# Patient Record
Sex: Female | Born: 1975 | Race: Asian | Hispanic: No | Marital: Married | Smoking: Never smoker
Health system: Southern US, Community
[De-identification: ages and names within clinical notes are randomized; demographics above are authoritative.]

## PROBLEM LIST (undated history)

## (undated) DIAGNOSIS — Z789 Other specified health status: Secondary | ICD-10-CM

---

## 2007-06-24 ENCOUNTER — Ambulatory Visit (HOSPITAL_COMMUNITY): Admission: RE | Admit: 2007-06-24 | Discharge: 2007-06-24 | Payer: Self-pay | Admitting: Obstetrics and Gynecology

## 2007-07-20 ENCOUNTER — Inpatient Hospital Stay (HOSPITAL_COMMUNITY): Admission: RE | Admit: 2007-07-20 | Discharge: 2007-07-23 | Payer: Self-pay | Admitting: Obstetrics and Gynecology

## 2008-07-29 IMAGING — US US OB DETAIL+14 WK
1 series · 14 of 28 positions shown · non-contrast
Comparison: none

OBSTETRICAL ULTRASOUND:
 This ultrasound was performed in The [HOSPITAL], and the [facility] report will be stored to [REDACTED] PACS.

[Series 1: us ob detail+14 wk · 14 of 48 slices shown]
[im 2/48]
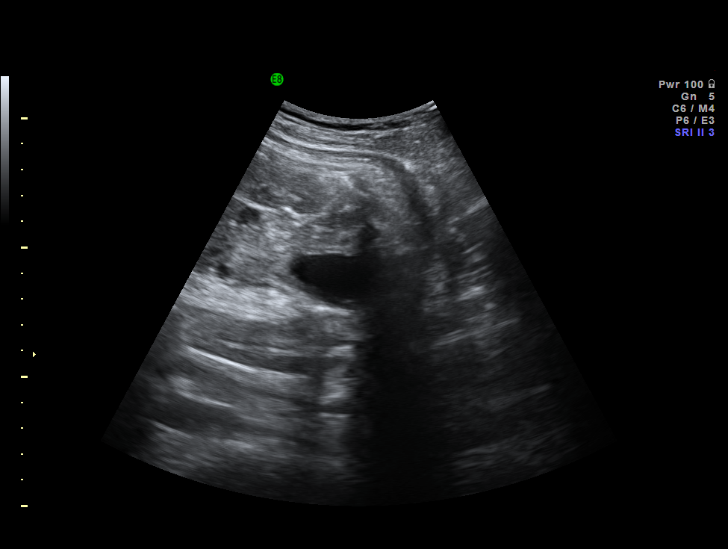
[im 6/48]
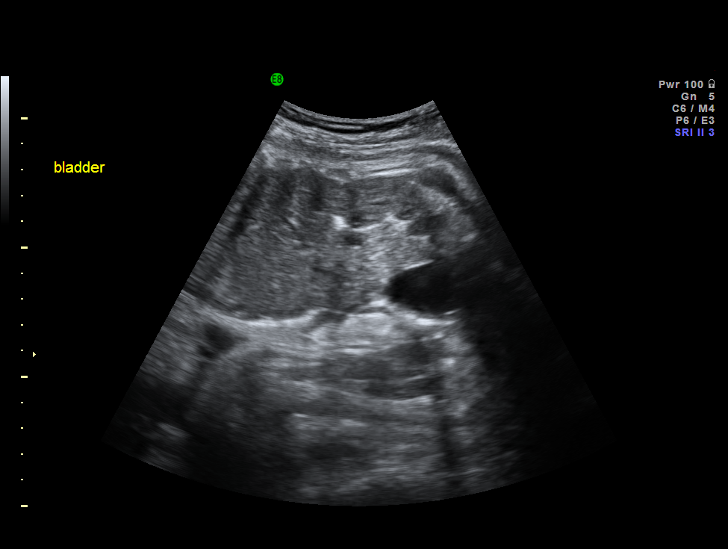
[im 9/48]
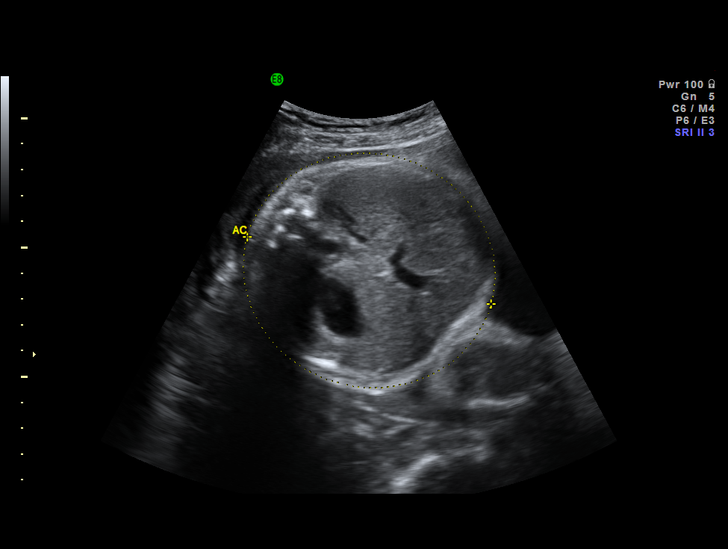
[im 13/48]
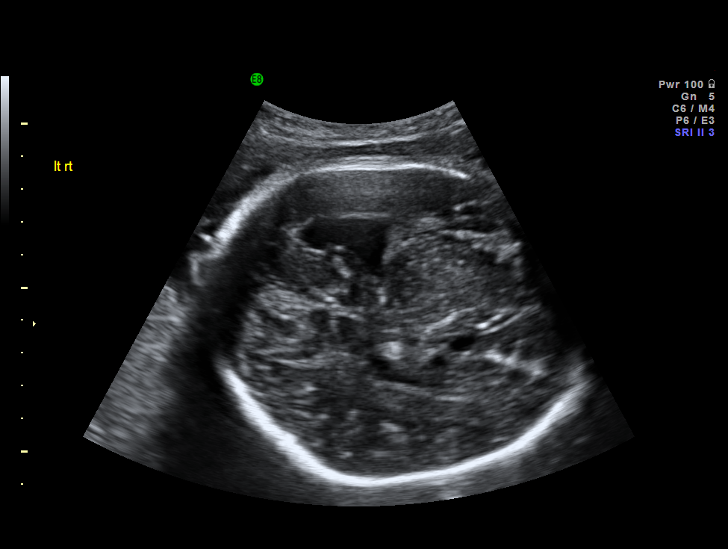
[im 16/48]
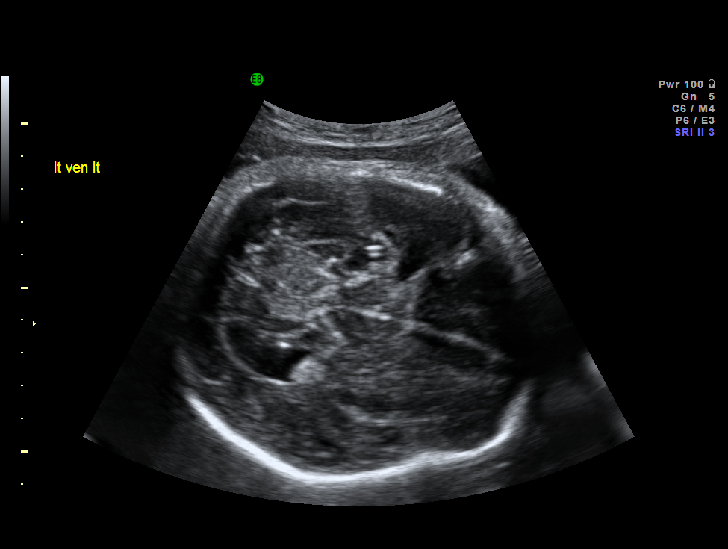
[im 20/48]
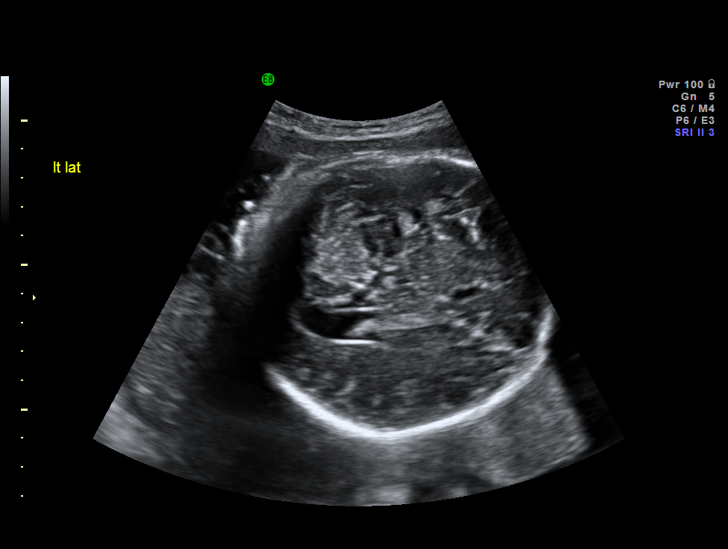
[im 23/48]
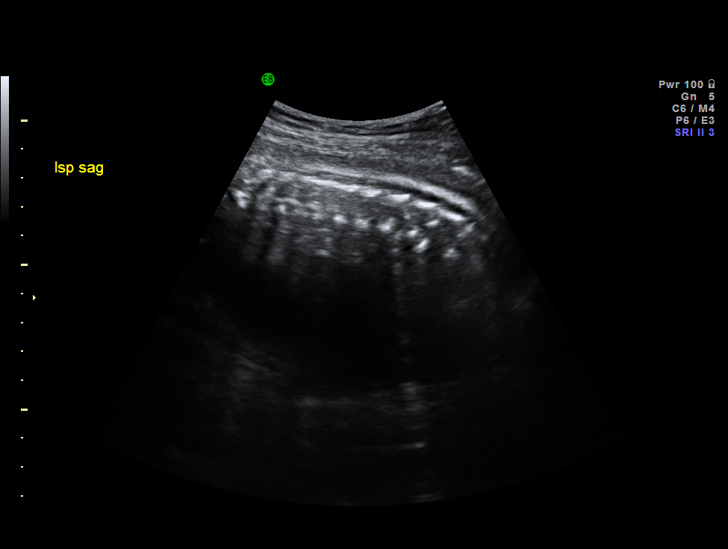
[im 27/48]
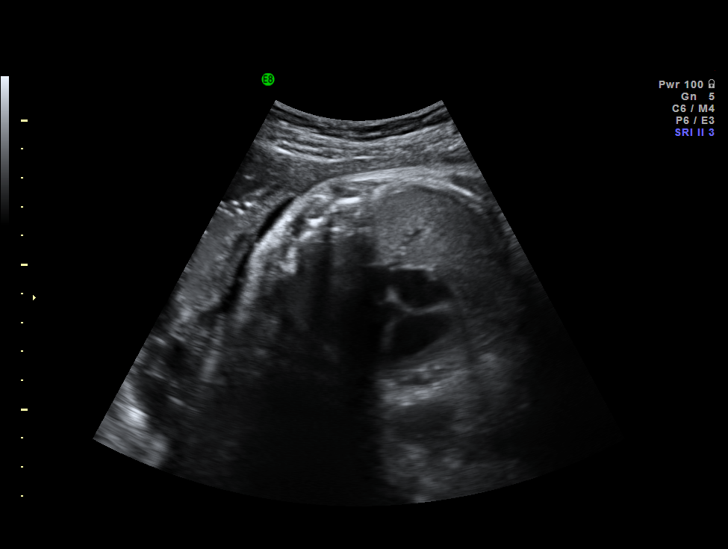
[im 30/48]
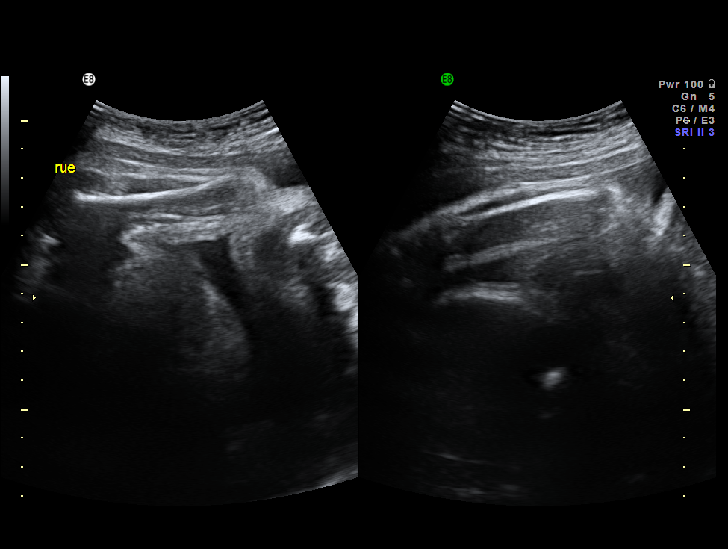
[im 34/48]
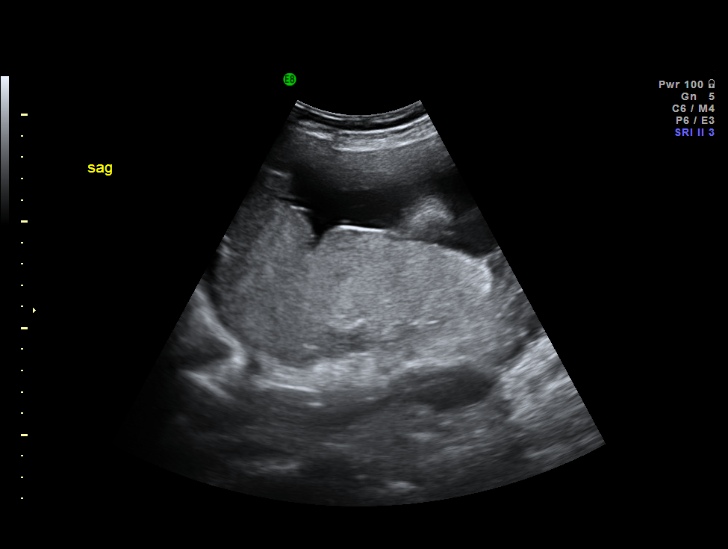
[im 37/48]
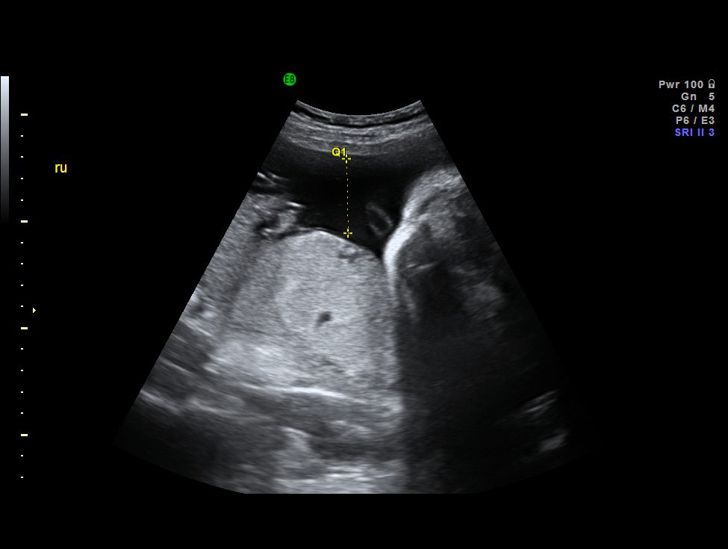
[im 41/48]
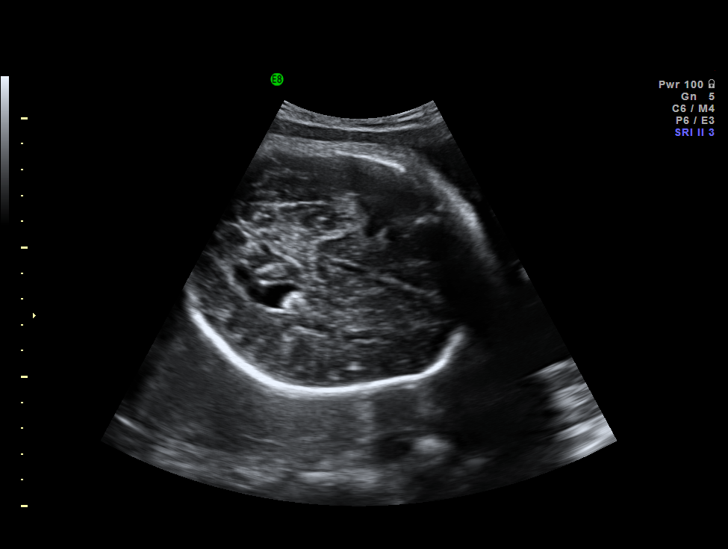
[im 44/48]
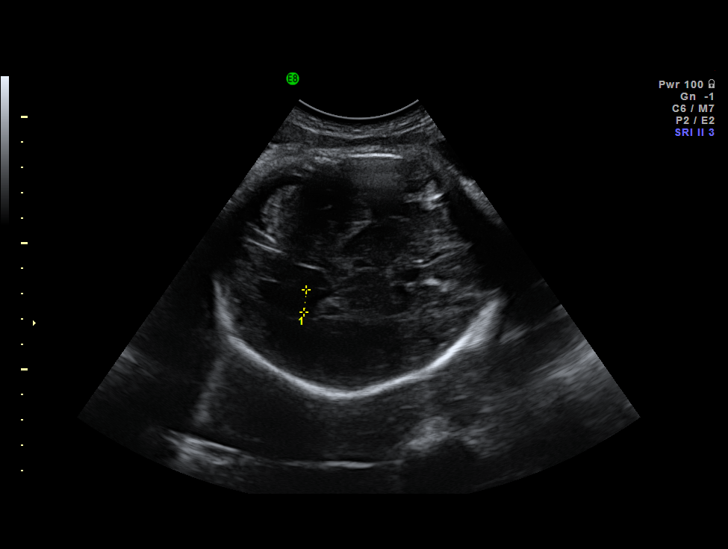
[im 48/48]
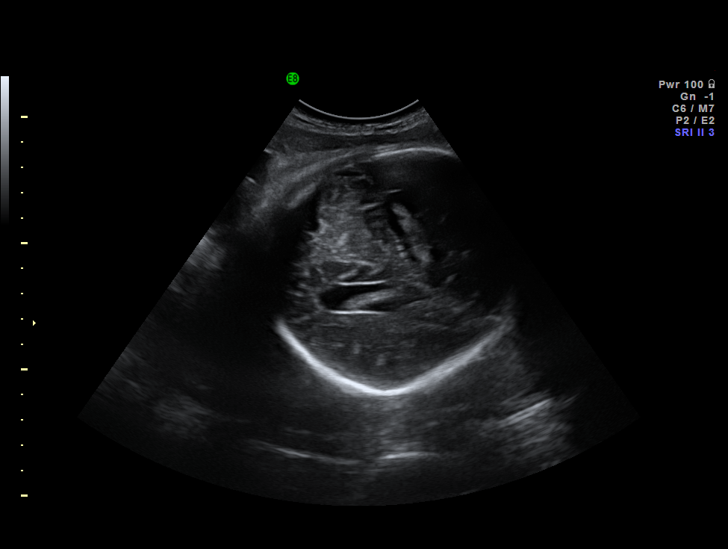

[14 of 28 positions shown; findings below may reference images not displayed]

IMPRESSION: The [facility] report has also been faxed to the ordering physician.

## 2010-07-30 ENCOUNTER — Encounter: Admission: RE | Admit: 2010-07-30 | Discharge: 2010-09-05 | Payer: Self-pay | Admitting: Obstetrics and Gynecology

## 2010-10-02 ENCOUNTER — Inpatient Hospital Stay (HOSPITAL_COMMUNITY): Admission: RE | Admit: 2010-10-02 | Discharge: 2010-10-05 | Payer: Self-pay | Admitting: Obstetrics and Gynecology

## 2011-02-18 LAB — CBC
Hemoglobin: 11.1 g/dL — ABNORMAL LOW (ref 12.0–15.0)
MCH: 34.1 pg — ABNORMAL HIGH (ref 26.0–34.0)
MCHC: 33.8 g/dL (ref 30.0–36.0)
MCV: 100.2 fL — ABNORMAL HIGH (ref 78.0–100.0)
Platelets: 179 10*3/uL (ref 150–400)
Platelets: 197 10*3/uL (ref 150–400)
RBC: 3.27 MIL/uL — ABNORMAL LOW (ref 3.87–5.11)
RDW: 14.3 % (ref 11.5–15.5)
WBC: 11.6 10*3/uL — ABNORMAL HIGH (ref 4.0–10.5)
WBC: 9 10*3/uL (ref 4.0–10.5)

## 2011-02-18 LAB — BASIC METABOLIC PANEL
BUN: 6 mg/dL (ref 6–23)
Chloride: 100 mEq/L (ref 96–112)
Creatinine, Ser: 0.34 mg/dL — ABNORMAL LOW (ref 0.4–1.2)
Glucose, Bld: 89 mg/dL (ref 70–99)
Potassium: 3.6 mEq/L (ref 3.5–5.1)

## 2011-02-18 LAB — GLUCOSE, CAPILLARY
Glucose-Capillary: 80 mg/dL (ref 70–99)
Glucose-Capillary: 93 mg/dL (ref 70–99)

## 2011-02-18 LAB — SURGICAL PCR SCREEN
MRSA, PCR: NEGATIVE
Staphylococcus aureus: NEGATIVE

## 2011-02-18 LAB — TYPE AND SCREEN: ABO/RH(D): O POS

## 2011-04-21 NOTE — Op Note (Signed)
NAME:  Mallory Baldwin, PFAHLER NO.:  [id number]   MEDICAL RECORD NO.:  [id number]          PATIENT TYPE:  INP   LOCATION:  9142                          FACILITY:  [facility]   PHYSICIAN:  Duke Salvia. Marcelle Overlie, M.D.DATE OF BIRTH:  Aug 15, 1976   DATE OF PROCEDURE:  07/20/2007  DATE OF DISCHARGE:                               OPERATIVE REPORT   PREOPERATIVE DIAGNOSIS:  1. Term intrauterine pregnancy.  2. Breech presentation.   POSTOPERATIVE DIAGNOSIS:  1. Term intrauterine pregnancy.  2. Breech presentation.   PROCEDURE:  Primary low transverse cesarean section.   SURGEON:  Dr. Marcelle Overlie   ANESTHESIA:  Spinal.   COMPLICATIONS:  None.   DRAINS:  Foley catheter   PROCEDURE AND FINDINGS:  The patient taken to the operating room, after  an adequate level of spinal anesthetic was obtained, the patient in left  tilt position.  The abdomen prepped and usual manner for sterile  abdominal procedures.  Foley catheter positioned draining clear.  Transverse Pfannenstiel incision was made two fingerbreadths above the  symphysis, carried down the fascia which was incised and extended  transversely.  Rectus muscle divided midline.  Peritoneum entered  superiorly without incident and extended in a vertical fashion.  The  vesicouterine serosa was incised and the bladder was bluntly and sharply  dissected off of the lower uterine segment and the bladder blade was  repositioned.  Transverse incision made, the lower segment extended with  bandage scissors.  Clear fluid noted.  The patient then delivered of a  healthy infant in the frank breech presentation.  Nuchal cord x1 was  noted easy delivery of the chest and vertex.  The infant was suctioned,  cord clamped and passed to pediatric team.  Apgars 08/09, female.  The  placenta was then removed manually intact.  Uterus exteriorized, cavity  wiped clean with laparotomy pack.  Closure was obtained first layer with  0 chromic in locked fashion  followed by an imbricating layer with 0  chromic.  This was hemostatic.  The tubes and ovaries were noted to be  normal.  The bladder flap area was intact and hemostatic.  Prior to  closure sponge, needle and instrument counts reported as correct x2.  Peritoneum closed with a running 2-0 Vicryl suture.  Rectus muscles  reapproximated with 2-0 Vicryl interrupted sutures.  Fascia closed from  laterally to midline on either side with a 0 PDS running suture.  Subcutaneous tissue was hemostatic.  Clips and Steri-Strips used on the  skin.  She did receive Ancef 1 gram IV after the cord was clamped along  Pitocin IV.  Clear urine noted at end of the case.  Mother and baby  doing well at that point.      Richard M. Marcelle Overlie, M.D.  Electronically Signed     RM[facility]/MEDQ  D:  07/20/2007  T:  07/20/2007  Job:  [id number]

## 2011-04-21 NOTE — H&P (Signed)
NAME:  Mallory Baldwin, Mallory Baldwin [facility]                ACCOUNT NO.:  [id number]   MEDICAL RECORD NO.:  [id number]          PATIENT TYPE:  INP   LOCATION:                                FACILITY:  [facility]   PHYSICIAN:  Duke Salvia. Marcelle Overlie, M.D.    DATE OF BIRTH:   DATE OF ADMISSION:  07/20/2007  DATE OF DISCHARGE:                              HISTORY & PHYSICAL   CHIEF COMPLAINT:  Breech presentation at term.   HISTORY OF PRESENT ILLNESS:  A 35 year old G1, P0 EDD August 23.  Has  been noted to have a persistent breech presentation, presents for  primary cesarean section. This procedure including risks of bleeding,  infection, transfusion, adjacent organ injury, wound infection, all  reviewed with her which she understands and accepts.   Her GBS screen has been positive.  This fetus has had borderline  ventriculomegaly, followed by serial ultrasound including evaluation by  maternal fetal medicine who recommended post natal follow up Pediatrics.   Recently, she had exposure to B19 parvovirus with IgG and IgM negative.  She has not had any URI or rash symptoms.   Her first trimester screen was normal with a 1/221 trisomy 21 risk.  She  declined further evaluation with amniocentesis.  Her blood type is O  positive.   ALLERGIES:  None.   For her past medical history including family history and social  history, please see her [facility] form.   PHYSICAL EXAMINATION:  VITAL SIGNS:  Temperature 98.2, blood pressure  120/82.  HEENT:  Unremarkable.  NECK:  Supple without masses.  LUNGS:  Clear.  CARDIOVASCULAR:  Regular rate and rhythm without murmurs, rubs or  gallops noted .  BREASTS:  Not examined.  ABDOMEN:  Term fundal height, fetal heart rate 140s.  Breech  presentation by Leopold's.  Cervix was closed.  EXTREMITIES:  Unremarkable.  NEUROLOGICAL:  Unremarkable.   IMPRESSION:  Persistent breech presentation at term.   PLAN:  Primary cesarean section.  Procedure and risks as reviewed  above.     Richard M. Marcelle Overlie, M.D.  Electronically Signed    RM[facility]/MEDQ  D:  07/19/2007  T:  07/20/2007  Job:  [id number]

## 2011-04-24 NOTE — Discharge Summary (Signed)
NAME:  Mallory Baldwin, Mallory Baldwin NO.:  [id number]   MEDICAL RECORD NO.:  [id number]          PATIENT TYPE:  INP   LOCATION:  9142                          FACILITY:  [facility]   PHYSICIAN:  Juluis Mire, M.D.   DATE OF BIRTH:  [birth year]   DATE OF ADMISSION:  07/20/2007  DATE OF DISCHARGE:  07/23/2007                               DISCHARGE SUMMARY   ADMITTING DIAGNOSES:  1. Intrauterine pregnancy at term.  2. Breech presentation.   DISCHARGE DIAGNOSIS:  Status post low transverse cesarean section with  delivery of a viable female infant.   PROCEDURE:  Primary low transverse cesarean section.   REASON FOR ADMISSION:  Please see dictated H&P.   HOSPITAL COURSE:  The patient is a 35 year old primigravida who was  admitted to [facility] for persistent breech  presentation.  Otherwise, pregnancy had been uncomplicated.  On the  morning of admission, the patient was taken to operating room, where  spinal anesthesia was administered without difficulty.  A low transverse  incision was made with delivery of a viable female infant weighing 7  pounds 6 ounces with Apgars of 9 at 1 minute and 9 at 5 minutes.  The  patient tolerated the procedure well and was taken to the recovery room  in stable condition.  On postoperative day #1, the patient was without  complaint.  Vital signs were stable.  She was afebrile.  Abdomen was  soft with good return of bowel function.  Fundus was firm and nontender.  Abdominal dressing was noted to have a scant amount of old drainage  noted on the bandage.  She is ambulating well.  Laboratory findings  revealed a hemoglobin of 10.1, platelet count 183,000, WBC count of  11.4.  Blood type was noted to be O positive.  On postoperative day #2,  the patient was without complaint.  Vital signs were stable.  Fundus was  firm and nontender.  Incision was clean, dry and intact.  She was  voiding well and tolerating a regular diet without complaints  of nausea  or vomiting.  On postoperative day #3, the patient was without  complaint.  Vital signs were stable.  Abdomen soft.  Fundus firm and  nontender.  Incision was clean, dry and intact.  Staples were removed  and the patient was later discharged home.   CONDITION ON DISCHARGE:  Good.   DIET:  Regular as tolerated.   ACTIVITY:  No heavy lifting.  No driving for 2 weeks.  No vaginal entry.   FOLLOWUP:  The patient is to follow up in the office in 1-2 weeks for  incision check.  She is to call for temperature greater than 100  degrees, persistent nausea, vomiting, heavy vaginal bleeding and/or  redness or drainage from the incisional site.   DISCHARGE MEDICATIONS:  1. Tylox, #30, one p.o. every 4-6 hours p.r.n.  2. Motrin 600 mg every 6 hours.  3. Prenatal vitamins one p.o. daily.  4. Colace one p.o. daily p.r.n.      Julio Sicks, N.P.      Raphael Gibney.  McComb, M.D.  Electronically Signed    CC/MEDQ  D:  08/25/2007  T:  08/25/2007  Job:  [id number]

## 2011-09-21 LAB — CBC
HCT: 35.7 — ABNORMAL LOW
Hemoglobin: 10.1 — ABNORMAL LOW
Hemoglobin: 12.1
MCHC: 33.7
MCV: 98.2
Platelets: 205
RBC: 3 — ABNORMAL LOW
RBC: 3.64 — ABNORMAL LOW
WBC: 10.4
WBC: 11.4 — ABNORMAL HIGH

## 2011-09-21 LAB — ABO/RH: ABO/RH(D): O POS

## 2011-09-21 LAB — TYPE AND SCREEN: Antibody Screen: NEGATIVE

## 2012-03-25 ENCOUNTER — Inpatient Hospital Stay (HOSPITAL_COMMUNITY)
Admission: AD | Admit: 2012-03-25 | Discharge: 2012-03-25 | Disposition: A | Discharge status: Home / Self Care | Payer: BC Managed Care – PPO | Source: Ambulatory Visit | Attending: Obstetrics and Gynecology | Admitting: Obstetrics and Gynecology

## 2012-03-25 ENCOUNTER — Encounter (HOSPITAL_COMMUNITY): Payer: Self-pay

## 2012-03-25 DIAGNOSIS — N920 Excessive and frequent menstruation with regular cycle: Secondary | ICD-10-CM

## 2012-03-25 DIAGNOSIS — N946 Dysmenorrhea, unspecified: Secondary | ICD-10-CM | POA: Insufficient documentation

## 2012-03-25 HISTORY — DX: Other specified health status: Z78.9

## 2012-03-25 LAB — URINALYSIS, ROUTINE W REFLEX MICROSCOPIC
Glucose, UA: NEGATIVE mg/dL
Leukocytes, UA: NEGATIVE
Protein, ur: NEGATIVE mg/dL
Specific Gravity, Urine: <1.005 — ABNORMAL LOW (ref 1.005–1.030)

## 2012-03-25 LAB — URINE MICROSCOPIC-ADD ON: WBC, UA: NONE SEEN WBC/hpf (ref <3)

## 2012-03-25 LAB — CBC
HCT: 36.2 % (ref 36.0–46.0)
Hemoglobin: 11.7 g/dL — ABNORMAL LOW (ref 12.0–15.0)
MCH: 30.5 pg (ref 26.0–34.0)
MCHC: 32.3 g/dL (ref 30.0–36.0)
RDW: 12.5 % (ref 11.5–15.5)

## 2012-03-25 MED ORDER — IBUPROFEN 600 MG PO TABS: 600.0000 mg | ORAL_TABLET | Freq: Four times a day (QID) | ORAL | Status: AC | PRN

## 2012-03-25 NOTE — MAU Note (Signed)
Patient states she started her period on 4-17 and it is very heavy and having a lot of cramping.

## 2012-03-25 NOTE — MAU Note (Signed)
Patient is wearing a tampon and unable to assess the bleeding. Denies feel weak or dizzy.

## 2012-03-25 NOTE — MAU Provider Note (Signed)
History     CSN: [id number]  Arrival date and time: 03/25/12 1541   First Provider Initiated Contact with Patient 03/25/12 1806      Chief Complaint  Patient presents with  . Vaginal Bleeding  . Abdominal Cramping   HPI This is a 36 y.o. female who presents with report of one day of heavier than normal menstrual bleeding and cramping for the first time. States has never had this happen before.  OB History    Grav Para Term Preterm Abortions TAB SAB Ect Mult Living   2 2 2  0 0 0 0 0 0 2      Past Medical History  Diagnosis Date  . No pertinent past medical history     Past Surgical History  Procedure Date  . Cesarean section     Family History  Problem Relation Age of Onset  . Anesthesia problems Neg Hx     History  Substance Use Topics  . Smoking status: Never Smoker   . Smokeless tobacco: Not on file  . Alcohol Use:     Allergies: No Known Allergies  Prescriptions prior to admission  Medication Sig Dispense Refill  . ibuprofen (ADVIL,MOTRIN) 200 MG tablet Take 400 mg by mouth every 6 (six) hours as needed. For pain.        ROS Physical Exam   Blood pressure 126/83, pulse 73, temperature 98.6 F (37 C), temperature source Oral, resp. rate 16, height 5\' 2"  (1.575 m), weight 109 lb 12.8 oz (49.805 kg), last menstrual period 03/23/2012, SpO2 100.00%.  Physical Exam  Constitutional: She is oriented to person, place, and time. She appears well-developed and well-nourished. No distress.  Cardiovascular: Normal rate.   Respiratory: Effort normal.  GI: Soft. She exhibits no distension and no mass. There is no tenderness. There is no rebound and no guarding.  Genitourinary: Uterus normal. Vaginal discharge found.       Moderate blood Uterus small and nontender Adnexae nontender  Musculoskeletal: Normal range of motion.  Neurological: She is alert and oriented to person, place, and time.  Skin: Skin is warm and dry.  Psychiatric: She has a normal mood and  affect.   Results for orders placed during the hospital encounter of 03/25/12 (from the past 24 hour(s))  URINALYSIS, ROUTINE W REFLEX MICROSCOPIC     Status: Abnormal   Collection Time   03/25/12  4:30 PM      Component Value Range   Color, Urine YELLOW  YELLOW    APPearance CLEAR  CLEAR    Specific Gravity, Urine <1.005 (*) 1.005 - 1.030    pH 6.0  5.0 - 8.0    Glucose, UA NEGATIVE  NEGATIVE (mg/dL)   Hgb urine dipstick TRACE (*) NEGATIVE    Bilirubin Urine NEGATIVE  NEGATIVE    Ketones, ur NEGATIVE  NEGATIVE (mg/dL)   Protein, ur NEGATIVE  NEGATIVE (mg/dL)   Urobilinogen, UA 0.2  0.0 - 1.0 (mg/dL)   Nitrite NEGATIVE  NEGATIVE    Leukocytes, UA NEGATIVE  NEGATIVE   URINE MICROSCOPIC-ADD ON     Status: Normal   Collection Time   03/25/12  4:30 PM      Component Value Range   Squamous Epithelial / LPF RARE  RARE    WBC, UA    <3 (WBC/hpf)   Value: NO FORMED ELEMENTS SEEN ON URINE MICROSCOPIC EXAMINATION   RBC / HPF 0-2  <3 (RBC/hpf)   Bacteria, UA RARE  RARE   POCT PREGNANCY, URINE  Status: Normal   Collection Time   03/25/12  5:54 PM      Component Value Range   Preg Test, Ur NEGATIVE  NEGATIVE   CBC     Status: Abnormal   Collection Time   03/25/12  6:18 PM      Component Value Range   WBC 7.1  4.0 - 10.5 (K/uL)   RBC 3.83 (*) 3.87 - 5.11 (MIL/uL)   Hemoglobin 11.7 (*) 12.0 - 15.0 (g/dL)   HCT 16.1  09.6 - 04.5 (%)   MCV 94.5  78.0 - 100.0 (fL)   MCH 30.5  26.0 - 34.0 (pg)   MCHC 32.3  30.0 - 36.0 (g/dL)   RDW 40.9  81.1 - 91.4 (%)   Platelets 287  150 - 400 (K/uL)    MAU Course  Procedures  Assessment and Plan  A:  Dysmenorrhea      Heavy menses x 1 day  P:  Discharge home       Discussed with Dr Henderson Cloud       Rx ibuprofen  Wynelle Bourgeois 03/25/2012, 6:41 PM

## 2012-03-25 NOTE — Discharge Instructions (Signed)
Dysmenorrhea Dysmenorrhea is pain during a menstrual period. The pain is caused by the tightening (contracting) of the muscles of the uterus. Headache, feeling sick to your stomach (nausea), throwing up (vomiting), or low back pain may occur with this condition. HOME CARE  Only take medicine as told by your doctor.   Place a heating pad or hot water bottle on your lower back or belly (abdomen). Do not sleep with a heating pad.   Exercise may help lessen the pain.   Massage the lower back or belly.   Stop smoking.   Avoid alcohol or caffeine.   Try yoga or acupuncture.  GET HELP RIGHT AWAY IF:   Your pain does not get better with medicine.   Your pain gets worse while taking pain medicine.   You have a fever.   You keep feeling sick to your stomach or keep throwing up.   Your pain moves to your upper belly.   Your period bleeding is heavier than normal.   You pass out (faint).  MAKE SURE YOU:  Understand these instructions.   Will watch your condition.   Will get help right away if you are not doing well or get worse.  Document Released: 02/19/2009 Document Revised: 11/12/2011 Document Reviewed: 03/31/2010 [employer] Patient Information 2012 [employer]. 

## 2013-04-25 ENCOUNTER — Other Ambulatory Visit: Payer: Self-pay | Admitting: Obstetrics and Gynecology

## 2013-04-25 DIAGNOSIS — R928 Other abnormal and inconclusive findings on diagnostic imaging of breast: Secondary | ICD-10-CM

## 2013-05-02 ENCOUNTER — Ambulatory Visit
Admission: RE | Admit: 2013-05-02 | Discharge: 2013-05-02 | Disposition: A | Discharge status: Home / Self Care | Payer: BC Managed Care – PPO | Source: Ambulatory Visit | Attending: Obstetrics and Gynecology | Admitting: Obstetrics and Gynecology

## 2013-05-02 DIAGNOSIS — R928 Other abnormal and inconclusive findings on diagnostic imaging of breast: Secondary | ICD-10-CM

## 2014-02-22 ENCOUNTER — Other Ambulatory Visit: Payer: Self-pay | Admitting: Obstetrics and Gynecology

## 2014-02-22 DIAGNOSIS — R921 Mammographic calcification found on diagnostic imaging of breast: Secondary | ICD-10-CM

## 2014-03-02 ENCOUNTER — Ambulatory Visit
Admission: RE | Admit: 2014-03-02 | Discharge: 2014-03-02 | Disposition: A | Discharge status: Home / Self Care | Payer: BC Managed Care – PPO | Source: Ambulatory Visit | Attending: Obstetrics and Gynecology | Admitting: Obstetrics and Gynecology

## 2014-03-02 DIAGNOSIS — R921 Mammographic calcification found on diagnostic imaging of breast: Secondary | ICD-10-CM

## 2014-04-11 ENCOUNTER — Other Ambulatory Visit: Payer: Self-pay | Admitting: Obstetrics and Gynecology

## 2014-04-11 ENCOUNTER — Other Ambulatory Visit: Payer: Self-pay

## 2014-04-11 DIAGNOSIS — R921 Mammographic calcification found on diagnostic imaging of breast: Secondary | ICD-10-CM

## 2014-06-04 ENCOUNTER — Ambulatory Visit
Admission: RE | Admit: 2014-06-04 | Discharge: 2014-06-04 | Disposition: A | Discharge status: Home / Self Care | Payer: BC Managed Care – PPO | Source: Ambulatory Visit | Attending: Obstetrics and Gynecology | Admitting: Obstetrics and Gynecology

## 2014-06-04 DIAGNOSIS — R921 Mammographic calcification found on diagnostic imaging of breast: Secondary | ICD-10-CM

## 2014-10-08 ENCOUNTER — Encounter (HOSPITAL_COMMUNITY): Payer: Self-pay
